# Patient Record
Sex: Male | Born: 1970
Health system: Southern US, Community
[De-identification: ages and names within clinical notes are randomized; demographics above are authoritative.]

## PROBLEM LIST (undated history)

## (undated) DIAGNOSIS — C44711 Basal cell carcinoma of skin of unspecified lower limb, including hip: Secondary | ICD-10-CM

## (undated) DIAGNOSIS — W3400XA Accidental discharge from unspecified firearms or gun, initial encounter: Secondary | ICD-10-CM

## (undated) DIAGNOSIS — C61 Malignant neoplasm of prostate: Secondary | ICD-10-CM

## (undated) DIAGNOSIS — Z87442 Personal history of urinary calculi: Secondary | ICD-10-CM

## (undated) DIAGNOSIS — K219 Gastro-esophageal reflux disease without esophagitis: Secondary | ICD-10-CM

## (undated) HISTORY — PX: OTHER SURGICAL HISTORY: SHX169

## (undated) HISTORY — PX: PROSTATE BIOPSY: SHX241

---

## 2006-03-14 ENCOUNTER — Inpatient Hospital Stay (HOSPITAL_COMMUNITY): Admission: AC | Admit: 2006-03-14 | Discharge: 2006-03-15 | Payer: Self-pay
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
## 2016-05-31 DIAGNOSIS — E7800 Pure hypercholesterolemia, unspecified: Secondary | ICD-10-CM | POA: Diagnosis not present

## 2018-04-12 DIAGNOSIS — Z0000 Encounter for general adult medical examination without abnormal findings: Secondary | ICD-10-CM | POA: Diagnosis not present

## 2018-04-12 DIAGNOSIS — Z6826 Body mass index (BMI) 26.0-26.9, adult: Secondary | ICD-10-CM | POA: Diagnosis not present

## 2018-04-12 DIAGNOSIS — E663 Overweight: Secondary | ICD-10-CM | POA: Diagnosis not present

## 2019-04-25 DIAGNOSIS — C44712 Basal cell carcinoma of skin of right lower limb, including hip: Secondary | ICD-10-CM | POA: Diagnosis not present

## 2019-04-25 DIAGNOSIS — L821 Other seborrheic keratosis: Secondary | ICD-10-CM | POA: Diagnosis not present

## 2019-04-25 DIAGNOSIS — L578 Other skin changes due to chronic exposure to nonionizing radiation: Secondary | ICD-10-CM | POA: Diagnosis not present

## 2019-04-25 DIAGNOSIS — D225 Melanocytic nevi of trunk: Secondary | ICD-10-CM | POA: Diagnosis not present

## 2019-04-25 DIAGNOSIS — L820 Inflamed seborrheic keratosis: Secondary | ICD-10-CM | POA: Diagnosis not present

## 2019-04-25 DIAGNOSIS — D2239 Melanocytic nevi of other parts of face: Secondary | ICD-10-CM | POA: Diagnosis not present

## 2019-05-23 DIAGNOSIS — C44712 Basal cell carcinoma of skin of right lower limb, including hip: Secondary | ICD-10-CM | POA: Diagnosis not present

## 2020-06-18 DIAGNOSIS — Z23 Encounter for immunization: Secondary | ICD-10-CM | POA: Diagnosis not present

## 2020-06-18 DIAGNOSIS — R5383 Other fatigue: Secondary | ICD-10-CM | POA: Diagnosis not present

## 2020-06-18 DIAGNOSIS — Z13220 Encounter for screening for lipoid disorders: Secondary | ICD-10-CM | POA: Diagnosis not present

## 2020-06-18 DIAGNOSIS — Z125 Encounter for screening for malignant neoplasm of prostate: Secondary | ICD-10-CM | POA: Diagnosis not present

## 2020-06-18 DIAGNOSIS — Z0000 Encounter for general adult medical examination without abnormal findings: Secondary | ICD-10-CM | POA: Diagnosis not present

## 2020-07-15 DIAGNOSIS — R972 Elevated prostate specific antigen [PSA]: Secondary | ICD-10-CM | POA: Diagnosis not present

## 2020-08-20 DIAGNOSIS — R972 Elevated prostate specific antigen [PSA]: Secondary | ICD-10-CM | POA: Diagnosis not present

## 2020-08-20 DIAGNOSIS — N401 Enlarged prostate with lower urinary tract symptoms: Secondary | ICD-10-CM | POA: Diagnosis not present

## 2020-09-10 DIAGNOSIS — N401 Enlarged prostate with lower urinary tract symptoms: Secondary | ICD-10-CM | POA: Diagnosis not present

## 2020-09-10 DIAGNOSIS — R972 Elevated prostate specific antigen [PSA]: Secondary | ICD-10-CM | POA: Diagnosis not present

## 2020-09-10 DIAGNOSIS — C61 Malignant neoplasm of prostate: Secondary | ICD-10-CM | POA: Diagnosis not present

## 2020-09-18 DIAGNOSIS — C61 Malignant neoplasm of prostate: Secondary | ICD-10-CM | POA: Diagnosis not present

## 2020-09-18 DIAGNOSIS — R972 Elevated prostate specific antigen [PSA]: Secondary | ICD-10-CM | POA: Diagnosis not present

## 2020-09-23 DIAGNOSIS — N200 Calculus of kidney: Secondary | ICD-10-CM | POA: Diagnosis not present

## 2020-09-23 DIAGNOSIS — C61 Malignant neoplasm of prostate: Secondary | ICD-10-CM | POA: Diagnosis not present

## 2020-09-23 DIAGNOSIS — K5730 Diverticulosis of large intestine without perforation or abscess without bleeding: Secondary | ICD-10-CM | POA: Diagnosis not present

## 2020-09-29 DIAGNOSIS — C61 Malignant neoplasm of prostate: Secondary | ICD-10-CM | POA: Diagnosis not present

## 2020-10-13 ENCOUNTER — Other Ambulatory Visit: Payer: Self-pay | Admitting: Urology

## 2020-10-13 DIAGNOSIS — C61 Malignant neoplasm of prostate: Secondary | ICD-10-CM | POA: Diagnosis not present

## 2020-10-27 NOTE — Progress Notes (Addendum)
COVID Vaccine Completed: x3 Date COVID Vaccine completed:  09-03-19 10-02-19 Has received booster:  08-21-20 COVID vaccine manufacturer:    Moderna    Date of COVID positive in last 90 days:  N/A  PCP - Collier Salina, NP Cardiologist -   Chest x-ray - N/A EKG - N/A Stress Test -  ECHO -  Cardiac Cath -  Pacemaker/ICD device last checked: Spinal Cord Stimulator:  Sleep Study - N/A CPAP -   Fasting Blood Sugar - N/A Checks Blood Sugar _____ times a day  Blood Thinner Instructions:  N/A Aspirin Instructions: Last Dose:  Activity level:  Can go up a flight of stairs and perform activities of daily living without stopping and without symptoms of chest pain or shortness of breath.  Able to exercise without symptoms    Anesthesia review: N/A  Patient denies shortness of breath, fever, cough and chest pain at PAT appointment.  BP elevated at PAT appointment 133/103, patient monitors at home and is within normal range.  Will monitor until surgery   Patient verbalized understanding of instructions that were given to them at the PAT appointment. Patient was also instructed that they will need to review over the PAT instructions again at home before surgery.

## 2020-10-27 NOTE — Patient Instructions (Addendum)
DUE TO COVID-19 ONLY ONE VISITOR IS ALLOWED TO COME WITH YOU AND STAY IN THE WAITING ROOM ONLY DURING PRE OP AND PROCEDURE.   IF YOU WILL BE ADMITTED INTO THE HOSPITAL YOU ARE ALLOWED ONLY TWO SUPPORT PEOPLE DURING VISITATION HOURS ONLY (10AM -8PM)   . The support person(s) may change daily. . The support person(s) must pass our screening, gel in and out, and wear a mask at all times, including in the patient's room. . Patients must also wear a mask when staff or their support person are in the room.  No visitors under the age of 41. Any visitor under the age of 8 must be accompanied by an adult.    COVID SWAB TESTING MUST BE COMPLETED ON:  Thursday, 10-29-20 @ 11:40 AM   [address] [place], [place] [address]  (Must self quarantine after testing. Follow instructions on handout.)    Your procedure is scheduled on:  Monday, 11-02-20   Report to [facility] Main  Entrance    Report to Short Stay at 5:30 AM   Call this number if you have problems the morning of surgery [phone]   Prep:  8 oz of Magnesium Citrate at noon day prior to surgery             Fleet enema the night before surgery   Do not eat food :After Midnight.   May have liquids until 4:15 AM  day of surgery  CLEAR LIQUID DIET  Foods Allowed                                                                     Foods Excluded  Water, Black Coffee and tea, regular and decaf              liquids that you cannot  Plain Jell-O in any flavor  (No red)                                     see through such as: Fruit ices (not with fruit pulp)                                      milk, soups, orange juice              Iced Popsicles (No red)                                      All solid food                                   Apple juices Sports drinks like Gatorade (No red) Lightly seasoned clear broth or consume(fat free) Sugar, honey syrup   Oral Hygiene is also important to reduce your risk of infection.  Remember - BRUSH YOUR TEETH THE MORNING OF SURGERY WITH YOUR REGULAR TOOTHPASTE   Do NOT smoke after Midnight   Take these medicines the morning of surgery with A SIP OF WATER: Famotidine and Loratadine if needed                               You may not have any metal on your body including, jewelry, and body piercings             Do not wear lotions, powders, perfumes/cologne, or deodorant             Men may shave face and neck.   Do not bring valuables to the hospital. [place].   Contacts, dentures or bridgework may not be worn into surgery.   Bring small overnight bag day of surgery.    Special Instructions: Bring a copy of your healthcare power of attorney and living will documents         the day of surgery if you haven't  scanned them in before.              Please read over the following fact sheets you were given: IF YOU HAVE QUESTIONS ABOUT YOUR PRE OP INSTRUCTIONS PLEASE CALL  [facility] - Preparing for Surgery Before surgery, you can play an important role.  Because skin is not sterile, your skin needs to be as free of germs as possible.  You can reduce the number of germs on your skin by washing with CHG (chlorahexidine gluconate) soap before surgery.  CHG is an antiseptic cleaner which kills germs and bonds with the skin to continue killing germs even after washing. Please DO NOT use if you have an allergy to CHG or antibacterial soaps.  If your skin becomes reddened/irritated stop using the CHG and inform your nurse when you arrive at Short Stay. Do not shave (including legs and underarms) for at least 48 hours prior to the first CHG shower.  You may shave your face/neck.  Please follow these instructions carefully:  1.  Shower with CHG Soap the night before surgery and the  morning of surgery.  2.  If you choose to wash your hair, wash your hair first as usual with  your normal  shampoo.  3.  After you shampoo, rinse your hair and body thoroughly to remove the shampoo.                             4.  Use CHG as you would any other liquid soap.  You can apply chg directly to the skin and wash.  Gently with a scrungie or clean washcloth.  5.  Apply the CHG Soap to your body ONLY FROM THE NECK DOWN.   Do   not use on face/ open                           Wound or open sores. Avoid contact with eyes, ears mouth and   genitals (private parts).                       Wash face,  Genitals (private parts) with your normal soap.  6.  Wash thoroughly, paying special attention to the area where your    surgery  will be performed.  7.  Thoroughly rinse your body with warm water from the neck down.  8.  DO NOT shower/wash with your normal soap after using and rinsing off the CHG Soap.                9.  Pat yourself dry with a clean towel.            10.  Wear clean pajamas.            11.  Place clean sheets on your bed the night of your first shower and do not  sleep with pets. Day of Surgery : Do not apply any lotions/deodorants the morning of surgery.  Please wear clean clothes to the hospital/surgery center.  FAILURE TO FOLLOW THESE INSTRUCTIONS MAY RESULT IN THE CANCELLATION OF YOUR SURGERY  PATIENT SIGNATURE_________________________________  NURSE SIGNATURE__________________________________  ________________________________________________________________________    Brandon Sosa  An incentive spirometer is a tool that can help keep your lungs clear and active. This tool measures how well you are filling your lungs with each breath. Taking long deep breaths may help reverse or decrease the chance of developing breathing (pulmonary) problems (especially infection) following:  A long period of time when you are unable to move or be active. BEFORE THE PROCEDURE   If the spirometer includes an indicator to show your best effort, your nurse  or respiratory therapist will set it to a desired goal.  If possible, sit up straight or lean slightly forward. Try not to slouch.  Hold the incentive spirometer in an upright position. INSTRUCTIONS FOR USE  1. Sit on the edge of your bed if possible, or sit up as far as you can in bed or on a chair. 2. Hold the incentive spirometer in an upright position. 3. Breathe out normally. 4. Place the mouthpiece in your mouth and seal your lips tightly around it. 5. Breathe in slowly and as deeply as possible, raising the piston or the ball toward the top of the column. 6. Hold your breath for 3-5 seconds or for as long as possible. Allow the piston or ball to fall to the bottom of the column. 7. Remove the mouthpiece from your mouth and breathe out normally. 8. Rest for a few seconds and repeat Steps 1 through 7 at least 10 times every 1-2 hours when you are awake. Take your time and take a few normal breaths between deep breaths. 9. The spirometer may include an indicator to show your best effort. Use the indicator as a goal to work toward during each repetition. 10. After each set of 10 deep breaths, practice coughing to be sure your lungs are clear. If you have an incision (the cut made at the time of surgery), support your incision when coughing by placing a pillow or rolled up towels firmly against it. Once you are able to get out of bed, walk around indoors and cough well. You may stop using the incentive spirometer when instructed by your caregiver.  RISKS AND COMPLICATIONS  Take your time so you do not get dizzy or light-headed.  If you are in pain, you may need to take or ask for pain medication before doing incentive spirometry. It is harder to take a deep breath if you are having pain. AFTER USE  Rest and breathe slowly and easily.  It can be helpful to keep track of  a log of your progress. Your caregiver can provide you with a simple table to help with this. If you are using the  spirometer at home, follow these instructions: [place] IF:   You are having difficultly using the spirometer.  You have trouble using the spirometer as often as instructed.  Your pain medication is not giving enough relief while using the spirometer.  You develop fever of 100.5 F (38.1 C) or higher. SEEK IMMEDIATE MEDICAL CARE IF:   You cough up bloody sputum that had not been present before.  You develop fever of 102 F (38.9 C) or greater.  You develop worsening pain at or near the incision site. MAKE SURE YOU:   Understand these instructions.  Will watch your condition.  Will get help right away if you are not doing well or get worse. Document Released: 12/12/2006 Document Revised: 10/24/2011 Document Reviewed: 02/12/2007 [employer] Patient Information 2014 [employer], [place].   ________________________________________________________________________  WHAT IS A BLOOD TRANSFUSION? Blood Transfusion Information  A transfusion is the replacement of blood or some of its parts. Blood is made up of multiple cells which provide different functions.  Red blood cells carry oxygen and are used for blood loss replacement.  White blood cells fight against infection.  Platelets control bleeding.  Plasma helps clot blood.  Other blood products are available for specialized needs, such as hemophilia or other clotting disorders. BEFORE THE TRANSFUSION  Who gives blood for transfusions?   Healthy volunteers who are fully evaluated to make sure their blood is safe. This is blood bank blood. Transfusion therapy is the safest it has ever been in the practice of medicine. Before blood is taken from a donor, a complete history is taken to make sure that person has no history of diseases nor engages in risky social behavior (examples are intravenous drug use or sexual activity with multiple partners). The donor's travel history is screened to minimize risk of transmitting  infections, such as malaria. The donated blood is tested for signs of infectious diseases, such as HIV and hepatitis. The blood is then tested to be sure it is compatible with you in order to minimize the chance of a transfusion reaction. If you or a relative donates blood, this is often done in anticipation of surgery and is not appropriate for emergency situations. It takes many days to process the donated blood. RISKS AND COMPLICATIONS Although transfusion therapy is very safe and saves many lives, the main dangers of transfusion include:   Getting an infectious disease.  Developing a transfusion reaction. This is an allergic reaction to something in the blood you were given. Every precaution is taken to prevent this. The decision to have a blood transfusion has been considered carefully by your caregiver before blood is given. Blood is not given unless the benefits outweigh the risks. AFTER THE TRANSFUSION  Right after receiving a blood transfusion, you will usually feel much better and more energetic. This is especially true if your red blood cells have gotten low (anemic). The transfusion raises the level of the red blood cells which carry oxygen, and this usually causes an energy increase.  The nurse administering the transfusion will monitor you carefully for complications. HOME CARE INSTRUCTIONS  No special instructions are needed after a transfusion. You may find your energy is better. Speak with your caregiver about any limitations on activity for underlying diseases you may have. SEEK MEDICAL CARE IF:   Your condition is not improving after your transfusion.  You develop redness or irritation at the intravenous (IV) site. SEEK IMMEDIATE MEDICAL CARE IF:  Any of the following symptoms occur over the next 12 hours:  Shaking chills.  You have a temperature by mouth above 102 F (38.9 C), not controlled by medicine.  Chest, back, or muscle pain.  People around you feel you are  not acting correctly or are confused.  Shortness of breath or difficulty breathing.  Dizziness and fainting.  You get a rash or develop hives.  You have a decrease in urine output.  Your urine turns a dark color or changes to pink, red, or brown. Any of the following symptoms occur over the next 10 days:  You have a temperature by mouth above 102 F (38.9 C), not controlled by medicine.  Shortness of breath.  Weakness after normal activity.  The white part of the eye turns yellow (jaundice).  You have a decrease in the amount of urine or are urinating less often.  Your urine turns a dark color or changes to pink, red, or brown. Document Released: 07/29/2000 Document Revised: 10/24/2011 Document Reviewed: 03/17/2008 [facility] Patient Information 2014 [place], [place].  _______________________________________________________________________

## 2020-10-29 ENCOUNTER — Other Ambulatory Visit (HOSPITAL_COMMUNITY)
Admission: RE | Admit: 2020-10-29 | Discharge: 2020-10-29 | Disposition: A | Discharge status: Home / Self Care | Payer: BC Managed Care – PPO | Source: Ambulatory Visit | Attending: Urology | Admitting: Urology

## 2020-10-29 ENCOUNTER — Encounter (HOSPITAL_COMMUNITY): Payer: Self-pay

## 2020-10-29 ENCOUNTER — Other Ambulatory Visit: Payer: Self-pay

## 2020-10-29 ENCOUNTER — Encounter (HOSPITAL_COMMUNITY)
Admission: RE | Admit: 2020-10-29 | Discharge: 2020-10-29 | Disposition: A | Discharge status: Home / Self Care | Payer: BC Managed Care – PPO | Source: Ambulatory Visit | Attending: Urology | Admitting: Urology

## 2020-10-29 DIAGNOSIS — Z01812 Encounter for preprocedural laboratory examination: Secondary | ICD-10-CM | POA: Diagnosis not present

## 2020-10-29 DIAGNOSIS — M6281 Muscle weakness (generalized): Secondary | ICD-10-CM | POA: Diagnosis not present

## 2020-10-29 DIAGNOSIS — Z20822 Contact with and (suspected) exposure to covid-19: Secondary | ICD-10-CM | POA: Diagnosis not present

## 2020-10-29 DIAGNOSIS — C61 Malignant neoplasm of prostate: Secondary | ICD-10-CM | POA: Diagnosis not present

## 2020-10-29 HISTORY — DX: Malignant neoplasm of prostate: C61

## 2020-10-29 HISTORY — DX: Basal cell carcinoma of skin of unspecified lower limb, including hip: C44.711

## 2020-10-29 HISTORY — DX: Personal history of urinary calculi: Z87.442

## 2020-10-29 HISTORY — DX: Gastro-esophageal reflux disease without esophagitis: K21.9

## 2020-10-29 HISTORY — DX: Accidental discharge from unspecified firearms or gun, initial encounter: W34.00XA

## 2020-10-29 LAB — SARS CORONAVIRUS 2 (TAT 6-24 HRS): SARS Coronavirus 2: NEGATIVE

## 2020-10-29 LAB — BASIC METABOLIC PANEL
Anion gap: 8 (ref 5–15)
BUN: 15 mg/dL (ref 6–20)
CO2: 27 mmol/L (ref 22–32)
Calcium: 9.2 mg/dL (ref 8.9–10.3)
Chloride: 106 mmol/L (ref 98–111)
Creatinine, Ser: 1.15 mg/dL (ref 0.61–1.24)
GFR, Estimated: >60 mL/min (ref >60)
Glucose, Bld: 103 mg/dL — ABNORMAL HIGH (ref 70–99)
Potassium: 4.1 mmol/L (ref 3.5–5.1)
Sodium: 141 mmol/L (ref 135–145)

## 2020-10-29 LAB — CBC
HCT: 47.3 % (ref 39.0–52.0)
Hemoglobin: 16.4 g/dL (ref 13.0–17.0)
MCH: 31.4 pg (ref 26.0–34.0)
MCHC: 34.7 g/dL (ref 30.0–36.0)
MCV: 90.4 fL (ref 80.0–100.0)
Platelets: 162 10*3/uL (ref 150–400)
RBC: 5.23 MIL/uL (ref 4.22–5.81)
RDW: 13 % (ref 11.5–15.5)
WBC: 3.8 10*3/uL — ABNORMAL LOW (ref 4.0–10.5)
nRBC: 0 % (ref 0.0–0.2)

## 2020-10-30 NOTE — H&P (Signed)
Office Visit Report     10/13/2020   --------------------------------------------------------------------------------   Brandon Sosa  MRN: [id number]  DOB: 05/21/1971, 50 year old Male  SSN:    PRIMARY CARE:  Marlena Clipper, NP  REFERRING:  Joie Bimler, MD  PROVIDER:  Raynelle Bring, M.D.  LOCATION:  [facility] [phone]     --------------------------------------------------------------------------------   CC/HPI: CC: Prostate Cancer   Physician requesting consult: Dr. 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   Family history: None.   Imaging studies: CT abdomen/pelvis (09/22/20): Negative for metastatic disease.   PMH: He has no major medical problems.  PSH: No abdominal surgeries.   TNM stage: cT1c N0 Mx  PSA: 4.5  Gleason score: 3+4=7  Biopsy (09/10/20): 4/10 cores positive  Left: L lateral mid (30%, 3+4=7), L mid (30%, 3+4=7), L lateral base (30%, 3+3=6), L base (20%, 3+4=7)  Right: Benign  Prostate volume: 18.7 cc   Nomogram  OC disease: 75%  EPE: 23%  SVI: 3%  LNI: 3%  PFS (5 year, 10 year): 88%, 80%   Urinary function: IPSS is 1.  Erectile function: SHIM score is 24.     ALLERGIES: Kenalog    MEDICATIONS: Claritin 10 mg tablet  Diclofenac Sodium 75 mg tablet, delayed release  Vitamin D     GU PSH: No GU PSH    NON-GU PSH: No Non-GU PSH    GU PMH: No GU PMH      PMH Notes: basal cell cancer on leg   NON-GU PMH: No Non-GU PMH    FAMILY HISTORY: hyperlipidemia - Father, Mother Hypertension - Father, Mother Prostate Cancer - Grandfather   SOCIAL HISTORY: Marital Status: Married Preferred Language: English; Ethnicity: Not Hispanic Or Latino; Race: White Current Smoking Status: Patient  has never smoked.   Tobacco Use Assessment Completed: Used Tobacco in last 30 days? Social Drinker.  Drinks 4+ caffeinated drinks per day.    REVIEW OF SYSTEMS:    GU Review Male:   Patient denies frequent urination, hard to postpone urination, burning/ pain with urination, get up at night to urinate, leakage of urine, stream starts and stops, trouble starting your streams, and have to strain to urinate .  Gastrointestinal (Upper):   Patient denies nausea and vomiting.  Gastrointestinal (Lower):   Patient denies diarrhea and constipation.  Constitutional:   Patient denies fever, night sweats, weight loss, and fatigue.  Skin:   Patient denies skin rash/ lesion and itching.  Eyes:   Patient denies blurred vision and double vision.  Ears/ Nose/ Throat:   Patient denies sore throat and sinus problems.  Hematologic/Lymphatic:   Patient denies swollen glands and easy bruising.  Cardiovascular:   Patient denies leg swelling and chest pains.  Respiratory:   Patient denies cough and shortness of breath.  Endocrine:   Patient denies excessive thirst.  Musculoskeletal:   Patient denies back pain and joint pain.  Neurological:   Patient denies headaches and dizziness.  Psychologic:   Patient denies depression and anxiety.   VITAL SIGNS:      10/13/2020 08:38 AM  Weight 187 lb / 84.82 kg  Height 71 in / 180.34 cm  BP 136/92 mmHg  Pulse 81 /min  Temperature 97.5 F / 36.3 C  BMI 26.1 kg/m   GU PHYSICAL EXAMINATION:    Prostate: Prostate about 30 grams. Left lobe normal consistency, right lobe normal consistency. Symmetrical lobes. No prostate nodule. Left lobe no tenderness, right lobe no tenderness.    MULTI-SYSTEM PHYSICAL EXAMINATION:    Constitutional: Well-nourished. No physical deformities. Normally developed. Good grooming.  Neck: Neck symmetrical, not swollen. Normal tracheal position.  Respiratory: No labored breathing, no use of accessory muscles. Clear bilaterally.   Cardiovascular: Normal temperature, normal extremity pulses, no swelling, no varicosities. Regular rate and rhythm.  Lymphatic: No enlargement of neck, axillae, groin.  Skin: No paleness, no jaundice, no cyanosis. No lesion, no ulcer, no rash.  Neurologic / Psychiatric: Oriented to time, oriented to place, oriented to person. No depression, no anxiety, no agitation.  Gastrointestinal: No mass, no tenderness, no rigidity, non obese abdomen.  Eyes: Normal conjunctivae. Normal eyelids.  Ears, Nose, Mouth, and Throat: Left ear no scars, no lesions, no masses. Right ear no scars, no lesions, no masses. Nose no scars, no lesions, no masses. Normal hearing. Normal lips.  Musculoskeletal: Normal gait and station of head and neck.     Complexity of Data:  Lab Test Review:   PSA  Records Review:   Pathology Reports, Previous Patient Records   PROCEDURES:          Urinalysis Dipstick Dipstick Cont'd  Color: Yellow Bilirubin: Neg mg/dL  Appearance: Clear Ketones: Neg mg/dL  Specific Gravity: 1.020 Blood: Neg ery/uL  pH: 6.0 Protein: Neg mg/dL  Glucose: Neg mg/dL Urobilinogen: 0.2 mg/dL    Nitrites: Neg    Leukocyte Esterase: Neg leu/uL    ASSESSMENT:      ICD-10 Details  1 GU:   Prostate Cancer - C61    PLAN:           Schedule Return Visit/Planned Activity: Other See Visit Notes             Note: Will call to schedule surgery.  Return Visit/Planned Activity: Next Available Appointment - PT Referral             Note: Please schedule patient for preoperative PT prior to radical prostatectomy.            Document Letter(s):  Created for Patient: Clinical Summary         Notes:   1. Favorable intermediate risk prostate cancer: I had a detailed discussion with Mr. [name] today regarding his prostate cancer diagnosis. Considering his disease parameters and life expectancy, I did recommend therapy of curative intent. The patient was counseled about the natural history of prostate cancer  and the standard treatment options that are available for prostate cancer. It was explained to him how his age and life expectancy, clinical stage, Gleason score, and PSA affect his prognosis, the decision to proceed with additional staging studies, as well as how that information influences recommended treatment strategies. We discussed the roles for active surveillance, radiation therapy, surgical therapy, androgen deprivation, as well as ablative therapy options for the treatment of prostate cancer as appropriate to his individual cancer situation. We discussed the risks and benefits of these options with regard to their impact on cancer control and also in terms of potential adverse events, complications, and impact on quality of life particularly related to urinary and sexual function. The patient was encouraged to ask questions throughout the discussion today and all questions were answered to his stated satisfaction. In addition, the patient was provided with  and/or directed to appropriate resources and literature for further education about prostate cancer and treatment options. We discussed surgical therapy for prostate cancer including the different available surgical approaches. We discussed, in detail, the risks and expectations of surgery with regard to cancer control, urinary control, and erectile function as well as the expected postoperative recovery process. Additional risks of surgery including but not limited to bleeding, infection, hernia formation, nerve damage, lymphocele formation, bowel/rectal injury potentially necessitating colostomy, damage to the urinary tract resulting in urine leakage, urethral stricture, and the cardiopulmonary risks such as myocardial infarction, stroke, death, venothromboembolism, etc. were explained. The risk of open surgical conversion for robotic/laparoscopic prostatectomy was also discussed.   He does adamantly wished to proceed with surgical therapy  considering his young age and long life expectancy. He will be scheduled for a bilateral nerve-sparing robot assisted laparoscopic radical prostatectomy and bilateral pelvic lymphadenectomy.   Cc: Marlena Clipper, NP  Dr. Joie Bimler         Next Appointment:      Next Appointment: 10/29/2020 09:00 AM    Appointment Type: 60 Physical Therapy    Location: [facility] [phone]    Provider: Nyra Capes    Reason for Visit: Inital pt eval for preoperative PT prior to radical prostatectomy.      E & M CODES: We spent 65 minutes dedicated to evaluation and management time, including face to face interaction, discussions on coordination of care, documentation, result review, and discussion with others as applicable.     * Signed by Raynelle Bring, M.D. on 10/14/20 at 5:15 PM (EST)*     The information contained in this medical record document is considered private and confidential patient information. This information can only be used for the medical diagnosis and/or medical services that are being provided by the patient's selected caregivers. This information can only be distributed

## 2020-11-01 NOTE — Anesthesia Preprocedure Evaluation (Addendum)
Anesthesia Evaluation  Patient identified by MRN, date of birth, ID band Patient awake    Reviewed: Allergy & Precautions, NPO status , Patient's Chart, lab work & pertinent test results  History of Anesthesia Complications Negative for: history of anesthetic complications  Airway Mallampati: I  TM Distance: >3 FB Neck ROM: Full    Dental no notable dental hx. (+) Teeth Intact   Pulmonary neg pulmonary ROS,    Pulmonary exam normal        Cardiovascular negative cardio ROS Normal cardiovascular exam     Neuro/Psych negative neurological ROS  negative psych ROS   GI/Hepatic Neg liver ROS, GERD  Medicated,  Endo/Other  negative endocrine ROS  Renal/GU negative Renal ROS   Prostate cancer    Musculoskeletal negative musculoskeletal ROS (+)   Abdominal   Peds  Hematology negative hematology ROS (+)   Anesthesia Other Findings   Reproductive/Obstetrics                            Anesthesia Physical Anesthesia Plan  ASA: II  Anesthesia Plan: General   Post-op Pain Management:    Induction: Intravenous  PONV Risk Score and Plan: 2 and Ondansetron, Dexamethasone, Treatment may vary due to age or medical condition and Midazolam  Airway Management Planned: Oral ETT  Additional Equipment: None  Intra-op Plan:   Post-operative Plan: Extubation in OR  Informed Consent: I have reviewed the patients History and Physical, chart, labs and discussed the procedure including the risks, benefits and alternatives for the proposed anesthesia with the patient or authorized representative who has indicated his/her understanding and acceptance.     Dental advisory given  Plan Discussed with:   Anesthesia Plan Comments:        Anesthesia Quick Evaluation

## 2020-11-02 ENCOUNTER — Encounter (HOSPITAL_COMMUNITY): Payer: Self-pay | Admitting: Urology

## 2020-11-02 ENCOUNTER — Observation Stay (HOSPITAL_COMMUNITY)
Admission: RE | Admit: 2020-11-02 | Discharge: 2020-11-03 | Disposition: A | Discharge status: Home / Self Care | Payer: BC Managed Care – PPO | Attending: Urology | Admitting: Urology

## 2020-11-02 ENCOUNTER — Ambulatory Visit (HOSPITAL_COMMUNITY): Payer: BC Managed Care – PPO | Admitting: Certified Registered"

## 2020-11-02 ENCOUNTER — Encounter (HOSPITAL_COMMUNITY)
Admission: RE | Disposition: A | Discharge status: Home / Self Care | Payer: Self-pay | Source: Home / Self Care | Attending: Urology

## 2020-11-02 ENCOUNTER — Other Ambulatory Visit: Payer: Self-pay

## 2020-11-02 DIAGNOSIS — R972 Elevated prostate specific antigen [PSA]: Secondary | ICD-10-CM | POA: Diagnosis not present

## 2020-11-02 DIAGNOSIS — C61 Malignant neoplasm of prostate: Principal | ICD-10-CM | POA: Diagnosis present

## 2020-11-02 DIAGNOSIS — K219 Gastro-esophageal reflux disease without esophagitis: Secondary | ICD-10-CM | POA: Diagnosis not present

## 2020-11-02 DIAGNOSIS — Z79899 Other long term (current) drug therapy: Secondary | ICD-10-CM | POA: Diagnosis not present

## 2020-11-02 DIAGNOSIS — C44711 Basal cell carcinoma of skin of unspecified lower limb, including hip: Secondary | ICD-10-CM | POA: Diagnosis not present

## 2020-11-02 HISTORY — PX: LYMPHADENECTOMY: SHX5960

## 2020-11-02 HISTORY — PX: ROBOT ASSISTED LAPAROSCOPIC RADICAL PROSTATECTOMY: SHX5141

## 2020-11-02 LAB — ABO/RH: ABO/RH(D): A POS

## 2020-11-02 LAB — TYPE AND SCREEN
ABO/RH(D): A POS
Antibody Screen: NEGATIVE

## 2020-11-02 LAB — HEMOGLOBIN AND HEMATOCRIT, BLOOD
HCT: 39 % (ref 39.0–52.0)
Hemoglobin: 13.5 g/dL (ref 13.0–17.0)

## 2020-11-02 SURGERY — XI ROBOTIC ASSISTED LAPAROSCOPIC RADICAL PROSTATECTOMY LEVEL 2
Anesthesia: General

## 2020-11-02 MED ORDER — FENTANYL CITRATE (PF) 100 MCG/2ML IJ SOLN
25.0000 ug | INTRAMUSCULAR | Status: DC | PRN
  Administered 2020-11-02: 50 ug via INTRAVENOUS

## 2020-11-02 MED ORDER — BACITRACIN-NEOMYCIN-POLYMYXIN 400-5-5000 EX OINT
1.0000 "application " | TOPICAL_OINTMENT | Freq: Three times a day (TID) | CUTANEOUS | Status: DC | PRN
  Administered 2020-11-02: 1 via TOPICAL

## 2020-11-02 MED ORDER — SODIUM CHLORIDE 0.9 % IV BOLUS
1000.0000 mL | Freq: Once | INTRAVENOUS | Status: AC
  Administered 2020-11-02: 1000 mL via INTRAVENOUS

## 2020-11-02 MED ORDER — ACETAMINOPHEN 10 MG/ML IV SOLN
INTRAVENOUS | Status: AC
  Filled 2020-11-02: qty 100

## 2020-11-02 MED ORDER — STERILE WATER FOR IRRIGATION IR SOLN
Status: DC | PRN
  Administered 2020-11-02: 1000 mL

## 2020-11-02 MED ORDER — GLYCOPYRROLATE PF 0.2 MG/ML IJ SOSY
PREFILLED_SYRINGE | INTRAMUSCULAR | Status: DC | PRN
  Administered 2020-11-02 (×2): .1 mg via INTRAVENOUS

## 2020-11-02 MED ORDER — ACETAMINOPHEN 325 MG PO TABS: 650.0000 mg | ORAL_TABLET | ORAL | Status: DC | PRN

## 2020-11-02 MED ORDER — CHLORHEXIDINE GLUCONATE CLOTH 2 % EX PADS
6.0000 | MEDICATED_PAD | Freq: Every day | CUTANEOUS | Status: DC
  Administered 2020-11-02: 6 via TOPICAL

## 2020-11-02 MED ORDER — LORATADINE 10 MG PO TABS: 10.0000 mg | ORAL_TABLET | Freq: Every day | ORAL | Status: DC | PRN

## 2020-11-02 MED ORDER — ACETAMINOPHEN 10 MG/ML IV SOLN
INTRAVENOUS | Status: DC | PRN
  Administered 2020-11-02: 1000 mg via INTRAVENOUS

## 2020-11-02 MED ORDER — HEPARIN SODIUM (PORCINE) 1000 UNIT/ML IJ SOLN
INTRAMUSCULAR | Status: AC
  Filled 2020-11-02: qty 1

## 2020-11-02 MED ORDER — KETOROLAC TROMETHAMINE 15 MG/ML IJ SOLN
15.0000 mg | Freq: Four times a day (QID) | INTRAMUSCULAR | Status: DC
  Administered 2020-11-02 – 2020-11-03 (×5): 15 mg via INTRAVENOUS
  Filled 2020-11-02 (×5): qty 1

## 2020-11-02 MED ORDER — ORAL CARE MOUTH RINSE: 15.0000 mL | Freq: Once | OROMUCOSAL | Status: AC

## 2020-11-02 MED ORDER — CHLORHEXIDINE GLUCONATE 0.12 % MT SOLN
15.0000 mL | Freq: Once | OROMUCOSAL | Status: AC
  Administered 2020-11-02: 15 mL via OROMUCOSAL

## 2020-11-02 MED ORDER — MIDAZOLAM HCL 5 MG/5ML IJ SOLN
INTRAMUSCULAR | Status: DC | PRN
  Administered 2020-11-02: 2 mg via INTRAVENOUS

## 2020-11-02 MED ORDER — FENTANYL CITRATE (PF) 100 MCG/2ML IJ SOLN
INTRAMUSCULAR | Status: DC | PRN
  Administered 2020-11-02 (×2): 50 ug via INTRAVENOUS
  Administered 2020-11-02: 25 ug via INTRAVENOUS
  Administered 2020-11-02 (×2): 100 ug via INTRAVENOUS
  Administered 2020-11-02: 25 ug via INTRAVENOUS

## 2020-11-02 MED ORDER — ONDANSETRON HCL 4 MG/2ML IJ SOLN: 4.0000 mg | INTRAMUSCULAR | Status: DC | PRN

## 2020-11-02 MED ORDER — CEFAZOLIN SODIUM-DEXTROSE 2-4 GM/100ML-% IV SOLN
2.0000 g | Freq: Once | INTRAVENOUS | Status: AC
  Administered 2020-11-02: 2 g via INTRAVENOUS

## 2020-11-02 MED ORDER — LACTATED RINGERS IV SOLN: INTRAVENOUS | Status: DC | PRN

## 2020-11-02 MED ORDER — CEFAZOLIN SODIUM-DEXTROSE 2-4 GM/100ML-% IV SOLN
INTRAVENOUS | Status: AC
  Filled 2020-11-02: qty 100

## 2020-11-02 MED ORDER — SULFAMETHOXAZOLE-TRIMETHOPRIM 800-160 MG PO TABS: 1.0000 | ORAL_TABLET | Freq: Two times a day (BID) | ORAL | 0 refills | Status: AC

## 2020-11-02 MED ORDER — MORPHINE SULFATE (PF) 2 MG/ML IV SOLN
2.0000 mg | INTRAVENOUS | Status: DC | PRN
  Administered 2020-11-02: 2 mg via INTRAVENOUS
  Filled 2020-11-02: qty 1

## 2020-11-02 MED ORDER — BUPIVACAINE-EPINEPHRINE (PF) 0.25% -1:200000 IJ SOLN
INTRAMUSCULAR | Status: AC
  Filled 2020-11-02: qty 30

## 2020-11-02 MED ORDER — PROPOFOL 10 MG/ML IV BOLUS
INTRAVENOUS | Status: DC | PRN
  Administered 2020-11-02: 200 mg via INTRAVENOUS

## 2020-11-02 MED ORDER — MAGNESIUM CITRATE PO SOLN
1.0000 | Freq: Once | ORAL | Status: DC
  Filled 2020-11-02: qty 296

## 2020-11-02 MED ORDER — BELLADONNA ALKALOIDS-OPIUM 16.2-60 MG RE SUPP: 1.0000 | Freq: Four times a day (QID) | RECTAL | Status: DC | PRN

## 2020-11-02 MED ORDER — DOCUSATE SODIUM 100 MG PO CAPS
100.0000 mg | ORAL_CAPSULE | Freq: Two times a day (BID) | ORAL | Status: DC
  Administered 2020-11-02 – 2020-11-03 (×2): 100 mg via ORAL
  Filled 2020-11-02 (×2): qty 1

## 2020-11-02 MED ORDER — KCL IN DEXTROSE-NACL 20-5-0.45 MEQ/L-%-% IV SOLN
INTRAVENOUS | Status: DC
  Filled 2020-11-02 (×2): qty 1000

## 2020-11-02 MED ORDER — SODIUM CHLORIDE 0.9 % IR SOLN
Status: DC | PRN
  Administered 2020-11-02: 1000 mL via INTRAVESICAL

## 2020-11-02 MED ORDER — FLEET ENEMA 7-19 GM/118ML RE ENEM
1.0000 | ENEMA | Freq: Once | RECTAL | Status: DC
  Filled 2020-11-02: qty 1

## 2020-11-02 MED ORDER — LACTATED RINGERS IV SOLN: INTRAVENOUS | Status: DC

## 2020-11-02 MED ORDER — OXYCODONE HCL 5 MG/5ML PO SOLN
5.0000 mg | Freq: Once | ORAL | Status: DC | PRN
Start: 2020-11-02 — End: 2020-11-02

## 2020-11-02 MED ORDER — ROCURONIUM BROMIDE 10 MG/ML (PF) SYRINGE
PREFILLED_SYRINGE | INTRAVENOUS | Status: DC | PRN
  Administered 2020-11-02: 20 mg via INTRAVENOUS
  Administered 2020-11-02: 10 mg via INTRAVENOUS
  Administered 2020-11-02: 30 mg via INTRAVENOUS
  Administered 2020-11-02: 80 mg via INTRAVENOUS
  Administered 2020-11-02: 10 mg via INTRAVENOUS

## 2020-11-02 MED ORDER — DIPHENHYDRAMINE HCL 50 MG/ML IJ SOLN
12.5000 mg | Freq: Four times a day (QID) | INTRAMUSCULAR | Status: DC | PRN
Start: 2020-11-02 — End: 2020-11-03

## 2020-11-02 MED ORDER — BELLADONNA ALKALOIDS-OPIUM 16.2-60 MG RE SUPP
RECTAL | Status: AC
  Administered 2020-11-02: 1 via RECTAL
  Filled 2020-11-02: qty 1

## 2020-11-02 MED ORDER — SUGAMMADEX SODIUM 200 MG/2ML IV SOLN
INTRAVENOUS | Status: DC | PRN
  Administered 2020-11-02: 200 mg via INTRAVENOUS

## 2020-11-02 MED ORDER — FENTANYL CITRATE (PF) 100 MCG/2ML IJ SOLN
INTRAMUSCULAR | Status: AC
  Administered 2020-11-02: 50 ug via INTRAVENOUS
  Filled 2020-11-02: qty 2

## 2020-11-02 MED ORDER — BUPIVACAINE-EPINEPHRINE 0.25% -1:200000 IJ SOLN
INTRAMUSCULAR | Status: DC | PRN
  Administered 2020-11-02: 30 mL

## 2020-11-02 MED ORDER — PHENYLEPHRINE 40 MCG/ML (10ML) SYRINGE FOR IV PUSH (FOR BLOOD PRESSURE SUPPORT)
PREFILLED_SYRINGE | INTRAVENOUS | Status: DC | PRN
  Administered 2020-11-02 (×3): 80 ug via INTRAVENOUS

## 2020-11-02 MED ORDER — DEXAMETHASONE SODIUM PHOSPHATE 10 MG/ML IJ SOLN
INTRAMUSCULAR | Status: DC | PRN
  Administered 2020-11-02: 10 mg via INTRAVENOUS

## 2020-11-02 MED ORDER — CEFAZOLIN SODIUM-DEXTROSE 1-4 GM/50ML-% IV SOLN
1.0000 g | Freq: Three times a day (TID) | INTRAVENOUS | Status: AC
  Administered 2020-11-02 – 2020-11-03 (×2): 1 g via INTRAVENOUS
  Filled 2020-11-02 (×2): qty 50

## 2020-11-02 MED ORDER — DIPHENHYDRAMINE HCL 12.5 MG/5ML PO ELIX: 12.5000 mg | ORAL_SOLUTION | Freq: Four times a day (QID) | ORAL | Status: DC | PRN

## 2020-11-02 MED ORDER — ZOLPIDEM TARTRATE 5 MG PO TABS: 5.0000 mg | ORAL_TABLET | Freq: Every evening | ORAL | Status: DC | PRN

## 2020-11-02 MED ORDER — OXYCODONE HCL 5 MG PO TABS: 5.0000 mg | ORAL_TABLET | Freq: Once | ORAL | Status: DC | PRN

## 2020-11-02 MED ORDER — MENTHOL 3 MG MT LOZG
1.0000 | LOZENGE | OROMUCOSAL | Status: DC | PRN
  Administered 2020-11-02: 3 mg via ORAL
  Filled 2020-11-02: qty 9

## 2020-11-02 MED ORDER — LIDOCAINE 2% (20 MG/ML) 5 ML SYRINGE
INTRAMUSCULAR | Status: DC | PRN
  Administered 2020-11-02: 80 mg via INTRAVENOUS

## 2020-11-02 MED ORDER — FAMOTIDINE 20 MG PO TABS: 20.0000 mg | ORAL_TABLET | Freq: Every day | ORAL | Status: DC | PRN

## 2020-11-02 MED ORDER — AMISULPRIDE (ANTIEMETIC) 5 MG/2ML IV SOLN: 10.0000 mg | Freq: Once | INTRAVENOUS | Status: DC | PRN

## 2020-11-02 MED ORDER — ONDANSETRON HCL 4 MG/2ML IJ SOLN: 4.0000 mg | Freq: Once | INTRAMUSCULAR | Status: DC | PRN

## 2020-11-02 MED ORDER — PHENYLEPHRINE HCL-NACL 10-0.9 MG/250ML-% IV SOLN
INTRAVENOUS | Status: DC | PRN
  Administered 2020-11-02: 30 ug/min via INTRAVENOUS

## 2020-11-02 MED ORDER — EPHEDRINE SULFATE-NACL 50-0.9 MG/10ML-% IV SOSY
PREFILLED_SYRINGE | INTRAVENOUS | Status: DC | PRN
  Administered 2020-11-02: 5 mg via INTRAVENOUS
  Administered 2020-11-02: 10 mg via INTRAVENOUS

## 2020-11-02 MED ORDER — ONDANSETRON HCL 4 MG/2ML IJ SOLN
INTRAMUSCULAR | Status: DC | PRN
  Administered 2020-11-02: 4 mg via INTRAVENOUS

## 2020-11-02 MED ORDER — TRAMADOL HCL 50 MG PO TABS: 50.0000 mg | ORAL_TABLET | Freq: Four times a day (QID) | ORAL | 0 refills | Status: AC | PRN

## 2020-11-02 MED ORDER — FENTANYL CITRATE (PF) 250 MCG/5ML IJ SOLN
INTRAMUSCULAR | Status: AC
  Filled 2020-11-02: qty 5

## 2020-11-02 SURGICAL SUPPLY — 66 items
ADH SKN CLS APL DERMABOND .7 (GAUZE/BANDAGES/DRESSINGS) ×2
APL PRP STRL LF DISP 70% ISPRP (MISCELLANEOUS) ×2
APL SWBSTK 6 STRL LF DISP (MISCELLANEOUS) ×2
APPLICATOR COTTON TIP 6 STRL (MISCELLANEOUS) ×2 IMPLANT
APPLICATOR COTTON TIP 6IN STRL (MISCELLANEOUS) ×3
CATH FOLEY 2WAY SLVR 18FR 30CC (CATHETERS) ×3 IMPLANT
CATH ROBINSON RED A/P 16FR (CATHETERS) ×3 IMPLANT
CATH ROBINSON RED A/P 8FR (CATHETERS) ×3 IMPLANT
CATH TIEMANN FOLEY 18FR 5CC (CATHETERS) ×3 IMPLANT
CHLORAPREP W/TINT 26 (MISCELLANEOUS) ×3 IMPLANT
CLIP VESOLOCK LG 6/CT PURPLE (CLIP) ×6 IMPLANT
COVER SURGICAL LIGHT HANDLE (MISCELLANEOUS) ×3 IMPLANT
COVER TIP SHEARS 8 DVNC (MISCELLANEOUS) ×2 IMPLANT
COVER TIP SHEARS 8MM DA VINCI (MISCELLANEOUS) ×3
COVER WAND RF STERILE (DRAPES) IMPLANT
CUTTER ECHEON FLEX ENDO 45 340 (ENDOMECHANICALS) ×3 IMPLANT
DECANTER SPIKE VIAL GLASS SM (MISCELLANEOUS) ×3 IMPLANT
DERMABOND ADVANCED (GAUZE/BANDAGES/DRESSINGS) ×1
DERMABOND ADVANCED .7 DNX12 (GAUZE/BANDAGES/DRESSINGS) ×2 IMPLANT
DRAIN CHANNEL RND F F (WOUND CARE) IMPLANT
DRAPE ARM DVNC X/XI (DISPOSABLE) ×8 IMPLANT
DRAPE COLUMN DVNC XI (DISPOSABLE) ×2 IMPLANT
DRAPE DA VINCI XI ARM (DISPOSABLE) ×12
DRAPE DA VINCI XI COLUMN (DISPOSABLE) ×3
DRAPE SURG IRRIG POUCH 19X23 (DRAPES) ×3 IMPLANT
DRSG TEGADERM 4X4.75 (GAUZE/BANDAGES/DRESSINGS) ×3 IMPLANT
ELECT PENCIL ROCKER SW 15FT (MISCELLANEOUS) ×3 IMPLANT
ELECT REM PT RETURN 15FT ADLT (MISCELLANEOUS) ×3 IMPLANT
GAUZE SPONGE 2X2 8PLY STRL LF (GAUZE/BANDAGES/DRESSINGS) IMPLANT
GLOVE SURG ENC MOIS LTX SZ6.5 (GLOVE) ×3 IMPLANT
GLOVE SURG ENC TEXT LTX SZ7.5 (GLOVE) ×6 IMPLANT
GOWN STRL REUS W/TWL LRG LVL3 (GOWN DISPOSABLE) ×9 IMPLANT
HOLDER FOLEY CATH W/STRAP (MISCELLANEOUS) ×3 IMPLANT
IRRIG SUCT STRYKERFLOW 2 WTIP (MISCELLANEOUS) ×3
IRRIGATION SUCT STRKRFLW 2 WTP (MISCELLANEOUS) ×2 IMPLANT
IV LACTATED RINGERS 1000ML (IV SOLUTION) ×3 IMPLANT
KIT TURNOVER KIT A (KITS) ×3 IMPLANT
NDL SAFETY ECLIPSE 18X1.5 (NEEDLE) ×2 IMPLANT
NEEDLE HYPO 18GX1.5 SHARP (NEEDLE) ×3
PACK ROBOT UROLOGY CUSTOM (CUSTOM PROCEDURE TRAY) ×3 IMPLANT
PENCIL SMOKE EVACUATOR (MISCELLANEOUS) IMPLANT
RELOAD STAPLE 45 4.1 GRN THCK (STAPLE) ×2 IMPLANT
SEAL CANN UNIV 5-8 DVNC XI (MISCELLANEOUS) ×8 IMPLANT
SEAL XI 5MM-8MM UNIVERSAL (MISCELLANEOUS) ×12
SET TUBE SMOKE EVAC HIGH FLOW (TUBING) ×3 IMPLANT
SOLUTION ELECTROLUBE (MISCELLANEOUS) ×3 IMPLANT
SPONGE GAUZE 2X2 STER 10/PKG (GAUZE/BANDAGES/DRESSINGS) ×1
STAPLE RELOAD 45 GRN (STAPLE) ×2 IMPLANT
STAPLE RELOAD 45MM GREEN (STAPLE) ×3
SUT ETHILON 3 0 PS 1 (SUTURE) ×3 IMPLANT
SUT MNCRL 3 0 RB1 (SUTURE) ×2 IMPLANT
SUT MNCRL 3 0 VIOLET RB1 (SUTURE) ×2 IMPLANT
SUT MNCRL AB 4-0 PS2 18 (SUTURE) ×6 IMPLANT
SUT MONOCRYL 3 0 RB1 (SUTURE) ×6
SUT VIC AB 0 CT1 27 (SUTURE) ×3
SUT VIC AB 0 CT1 27XBRD ANTBC (SUTURE) ×2 IMPLANT
SUT VIC AB 0 UR5 27 (SUTURE) ×3 IMPLANT
SUT VIC AB 2-0 SH 27 (SUTURE) ×3
SUT VIC AB 2-0 SH 27X BRD (SUTURE) ×2 IMPLANT
SUT VIC AB 3-0 SH 27 (SUTURE) ×3
SUT VIC AB 3-0 SH 27X BRD (SUTURE) IMPLANT
SUT VICRYL 0 UR6 27IN ABS (SUTURE) ×7 IMPLANT
SYR 27GX1/2 1ML LL SAFETY (SYRINGE) ×3 IMPLANT
TOWEL OR NON WOVEN STRL DISP B (DISPOSABLE) ×3 IMPLANT
TROCAR XCEL NON-BLD 5MMX100MML (ENDOMECHANICALS) IMPLANT
WATER STERILE IRR 1000ML POUR (IV SOLUTION) ×3 IMPLANT

## 2020-11-02 NOTE — Anesthesia Postprocedure Evaluation (Signed)
Anesthesia Post Note  Patient: Brandon Sosa  Procedure(s) Performed: XI ROBOTIC ASSISTED LAPAROSCOPIC RADICAL PROSTATECTOMY LEVEL 2 (N/A ) LYMPHADENECTOMY, PELVIC (Bilateral )     Patient location during evaluation: PACU Anesthesia Type: General Level of consciousness: awake and alert Pain management: pain level controlled Vital Signs Assessment: post-procedure vital signs reviewed and stable Respiratory status: spontaneous breathing, nonlabored ventilation and respiratory function stable Cardiovascular status: blood pressure returned to baseline and stable Postop Assessment: no apparent nausea or vomiting Anesthetic complications: no   No complications documented.  Last Vitals:  Vitals:   11/02/20 1115 11/02/20 1130  BP: 130/76 125/69  Pulse: (!) 101 100  Resp: 15 16  Temp:    SpO2: 100% 100%    Last Pain:  Vitals:   11/02/20 1130  TempSrc:   PainSc: 3                  Lidia Collum

## 2020-11-02 NOTE — Anesthesia Procedure Notes (Signed)
Procedure Name: Intubation Performed by: Cleda Daub, CRNA Pre-anesthesia Checklist: Patient identified, Emergency Drugs available, Suction available and Patient being monitored Patient Re-evaluated:Patient Re-evaluated prior to induction Oxygen Delivery Method: Circle system utilized Preoxygenation: Pre-oxygenation with 100% oxygen Induction Type: IV induction Ventilation: Mask ventilation without difficulty Laryngoscope Size: Mac and 4 Grade View: Grade I Tube type: Oral Tube size: 7.5 mm Number of attempts: 1 Airway Equipment and Method: Stylet and Oral airway Placement Confirmation: ETT inserted through vocal cords under direct vision,  positive ETCO2 and breath sounds checked- equal and bilateral Secured at: 23 cm Tube secured with: Tape Dental Injury: Teeth and Oropharynx as per pre-operative assessment

## 2020-11-02 NOTE — TOC Progression Note (Signed)
Transition of Care [facility]) - Progression Note    Patient Details  Name: Brandon Sosa MRN: [id number] Date of Birth: 1971-01-01  Transition of Care [facility]) CM/SW Contact  Purcell Mouton, RN Phone Number: 11/02/2020, 3:19 PM  Clinical Narrative:    Chart was reviewed by TOC. Will continue to follow for discharge needs.    Expected Discharge Plan: Home/Self Care Barriers to Discharge: No Barriers Identified  Expected Discharge Plan and Services Expected Discharge Plan: Home/Self Care       Living arrangements for the past 2 months: Single Family Home                                       Social Determinants of Health (SDOH) Interventions    Readmission Risk Interventions No flowsheet data found.

## 2020-11-02 NOTE — Op Note (Signed)
Preoperative diagnosis: Clinically localized adenocarcinoma of the prostate (clinical stage T1c N0 Mx)  Postoperative diagnosis: Clinically localized adenocarcinoma of the prostate (clinical stage T1c N0 Mx)  Procedure:  1. Robotic assisted laparoscopic radical prostatectomy (bilateral nerve sparing) 2. Bilateral robotic assisted laparoscopic pelvic lymphadenectomy  Surgeon: Pryor Curia. M.D.  Assistant: Debbrah Alar, PA-C  An assistant was required for this surgical procedure.  The duties of the assistant included but were not limited to suctioning, passing suture, camera manipulation, retraction. This procedure would not be able to be performed without an Environmental consultant.  Resident: Dr. Celene Squibb  Anesthesia: General  Complications: None  EBL: 100 mL  IVF:  2000 mL crystalloid  Specimens: 1. Prostate and seminal vesicles 2. Right pelvic lymph nodes 3. Left pelvic lymph nodes  Disposition of specimens: Pathology  Drains: 1. 20 Fr coude catheter 2. # 19 Blake pelvic drain  Indication: Brandon Sosa is a 50 y.o. year old patient with clinically localized prostate cancer.  After a thorough review of the management options for treatment of prostate cancer, he elected to proceed with surgical therapy and the above procedure(s).  We have discussed the potential benefits and risks of the procedure, side effects of the proposed treatment, the likelihood of the patient achieving the goals of the procedure, and any potential problems that might occur during the procedure or recuperation. Informed consent has been obtained.  Description of procedure:  The patient was taken to the operating room and a general anesthetic was administered. He was given preoperative antibiotics, placed in the dorsal lithotomy position, and prepped and draped in the usual sterile fashion. Next a preoperative timeout was performed. A urethral catheter was placed into the bladder and a site was  selected near the umbilicus for placement of the camera port. This was placed using a standard open Hassan technique which allowed entry into the peritoneal cavity under direct vision and without difficulty. An 8 mm robotic port was placed and a pneumoperitoneum established. The camera was then used to inspect the abdomen and there was no evidence of any intra-abdominal injuries or other abnormalities. The remaining abdominal ports were then placed. 8 mm robotic ports were placed in the right lower quadrant, left lower quadrant, and far left lateral abdominal wall. A 5 mm port was placed in the right upper quadrant and a 12 mm port was placed in the right lateral abdominal wall for laparoscopic assistance. All ports were placed under direct vision without difficulty. The surgical cart was then docked.   Utilizing the cautery scissors, the bladder was reflected posteriorly allowing entry into the space of Retzius and identification of the endopelvic fascia and prostate. The periprostatic fat was then removed from the prostate allowing full exposure of the endopelvic fascia. The endopelvic fascia was then incised from the apex back to the base of the prostate bilaterally and the underlying levator muscle fibers were swept laterally off the prostate thereby isolating the dorsal venous complex. The dorsal vein was then stapled and divided with a 45 mm Flex Echelon stapler. Attention then turned to the bladder neck which was divided anteriorly thereby allowing entry into the bladder and exposure of the urethral catheter. The catheter balloon was deflated and the catheter was brought into the operative field and used to retract the prostate anteriorly. The posterior bladder neck was then examined and was divided allowing further dissection between the bladder and prostate posteriorly until the vasa deferentia and seminal vessels were identified. The vasa deferentia were  isolated, divided, and lifted anteriorly. The  seminal vesicles were dissected down to their tips with care to control the seminal vascular arterial blood supply. These structures were then lifted anteriorly and the space between Denonvilliers fascia and the anterior rectum was developed with a combination of sharp and blunt dissection. This isolated the vascular pedicles of the prostate.  The lateral prostatic fascia was then sharply incised allowing release of the neurovascular bundles bilaterally. The vascular pedicles of the prostate were then ligated with Weck clips between the prostate and neurovascular bundles and divided with sharp cold scissor dissection resulting in neurovascular bundle preservation. The neurovascular bundles were then separated off the apex of the prostate and urethra bilaterally.  The urethra was then sharply transected allowing the prostate specimen to be disarticulated. The pelvis was copiously irrigated and hemostasis was ensured. There was no evidence for rectal injury.  Attention then turned to the right pelvic sidewall. The fibrofatty tissue between the external iliac vein, confluence of the iliac vessels, hypogastric artery, and Cooper's ligament was dissected free from the pelvic sidewall with care to preserve the obturator nerve. Weck clips were used for lymphostasis and hemostasis. An identical procedure was performed on the contralateral side and the lymphatic packets were removed for permanent pathologic analysis.  Attention then turned to the urethral anastomosis. A 2-0 Vicryl slip knot was placed between Denonvilliers fascia, the posterior bladder neck, and the posterior urethra to reapproximate these structures. A double-armed 3-0 Monocryl suture was then used to perform a 360 running tension-free anastomosis between the bladder neck and urethra. A new urethral catheter was then placed into the bladder and irrigated. There were no blood clots within the bladder and the anastomosis appeared to be watertight.  A #19 Blake drain was then brought through the left lateral 8 mm port site and positioned appropriately within the pelvis. It was secured to the skin with a nylon suture. The surgical cart was then undocked. The right lateral 12 mm port site was closed at the fascial level with a 0 Vicryl suture placed laparoscopically. All remaining ports were then removed under direct vision. The prostate specimen was removed intact within the Endopouch retrieval bag via the periumbilical camera port site. This fascial opening was closed with two running 0 Vicryl sutures. 0.25% Marcaine was then injected into all port sites and all incisions were reapproximated at the skin level with 4-0 Monocryl subcuticular sutures and Dermabond. The patient appeared to tolerate the procedure well and without complications. The patient was able to be extubated and transferred to the recovery unit in satisfactory condition.   Pryor Curia MD

## 2020-11-02 NOTE — Interval H&P Note (Signed)
History and Physical Interval Note:  11/02/2020 6:56 AM  Brandon Sosa  has presented today for surgery, with the diagnosis of PROSTATE CANCER.  The various methods of treatment have been discussed with the patient and family. After consideration of risks, benefits and other options for treatment, the patient has consented to  Procedure(s): XI ROBOTIC ASSISTED LAPAROSCOPIC RADICAL PROSTATECTOMY LEVEL 2 (N/A) LYMPHADENECTOMY, PELVIC (Bilateral) as a surgical intervention.  The patient's history has been reviewed, patient examined, no change in status, stable for surgery.  I have reviewed the patient's chart and labs.  Questions were answered to the patient's satisfaction.     [employer]

## 2020-11-02 NOTE — Discharge Instructions (Signed)
1. Activity:  You are encouraged to ambulate frequently (about every hour during waking hours) to help prevent blood clots from forming in your legs or lungs.  However, you should not engage in any heavy lifting (> 10-15 lbs), strenuous activity, or straining. 2. Diet: You should continue a clear liquid diet until passing gas from below.  Once this occurs, you may advance your diet to a soft diet that would be easy to digest (i.e soups, scrambled eggs, mashed potatoes, etc.) for 24 hours just as you would if getting over a bad stomach flu.  If tolerating this diet well for 24 hours, you may then begin eating regular food.  It will be normal to have some amount of bloating, nausea, and abdominal discomfort intermittently. 3. Prescriptions:  You will be provided a prescription for pain medication to take as needed.  If your pain is not severe enough to require the prescription pain medication, you may take Tylenol instead.  You should also take an over the counter stool softener (Colace 100 mg twice daily) to avoid straining with bowel movements as the pain medication may constipate you. Finally, you will also be provided a prescription for an antibiotic to begin the day prior to your return visit in the office for catheter removal. 4. Catheter care: You will be taught how to take care of the catheter by the nursing staff prior to discharge from the hospital.  You may use both a leg bag and the larger bedside bag but it is recommended to at least use the bigger bedside bag at nighttime as the leg bag is small and will fill up overnight and also does not drain as well when lying flat. You may periodically feel a strong urge to void with the catheter in place.  This is a bladder spasm and most often can occur when having a bowel movement or when you are moving around. It is typically self-limited and usually will stop after a few minutes.  You may use some Vaseline or Neosporin around the tip of the catheter to  reduce friction at the tip of the penis. 5. Incisions: You may remove your dressing bandages the 2nd day after surgery.  You most likely will have a few small staples in each of the incisions and once the bandages are removed, the incisions may stay open to air.  You may start showering (not soaking or bathing in water) 48 hours after surgery and the incisions simply need to be patted dry after the shower.  No additional care is needed. 6. What to call us about: You should call the office [phone]) if you develop fever > 101, persistent vomiting, or the catheter stops draining. Also, feel free to call with any other questions you may have and remember the handout that was provided to you as a reference preoperatively which answers many of the common questions that arise after surgery. 7. You may resume Voltaren, aspirin, advil, aleve, vitamins, and supplements 7 days after surgery.

## 2020-11-02 NOTE — Progress Notes (Signed)
Patient ID: Brandon Sosa, male   DOB: 1971-01-23, 50 y.o.   MRN: [id number]  . Post-op note  Subjective: The patient is doing well.  No complaints.  Objective: Vital signs in last 24 hours: Temp:  [97.7 F (36.5 C)-99.2 F (37.3 C)] 97.7 F (36.5 C) (03/21 1216) Pulse Rate:  [86-105] 105 (03/21 1216) Resp:  [12-18] 16 (03/21 1216) BP: (125-139)/(69-98) 132/82 (03/21 1216) SpO2:  [97 %-100 %] 97 % (03/21 1216) Weight:  [85.7 kg] 85.7 kg (03/21 1216)  Intake/Output from previous day: No intake/output data recorded. Intake/Output this shift: Total I/O In: 3440 [P.O.:240; I.V.:2000; IV Piggyback:1200] Out: 295 [Urine:250; Drains:15; Blood:30]  Physical Exam:  General: Alert and oriented. Abdomen: Soft, Nondistended. Incisions: Clean and dry. GU: Urine clear.  Lab Results: Recent Labs    11/02/20 1115  HGB 13.5  HCT 39.0    Assessment/Plan: POD#0   1) Continue to monitor, ambulate, IS   Pryor Curia. MD   LOS: 0 days   Brandon Sosa 11/02/2020, 2:16 PM

## 2020-11-02 NOTE — Transfer of Care (Signed)
Immediate Anesthesia Transfer of Care Note  Patient: Brandon Sosa  Procedure(s) Performed: XI ROBOTIC ASSISTED LAPAROSCOPIC RADICAL PROSTATECTOMY LEVEL 2 (N/A ) LYMPHADENECTOMY, PELVIC (Bilateral )  Patient Location: PACU  Anesthesia Type:General  Level of Consciousness: awake, alert , oriented and patient cooperative  Airway & Oxygen Therapy: Patient Spontanous Breathing and Patient connected to face mask oxygen  Post-op Assessment: Report given to RN and Post -op Vital signs reviewed and stable  Post vital signs: Reviewed and stable  Last Vitals:  Vitals Value Taken Time  BP 132/82 11/02/20 1055  Temp    Pulse 102 11/02/20 1058  Resp 13 11/02/20 1058  SpO2 100 % 11/02/20 1058  Vitals shown include unvalidated device data.  Last Pain:  Vitals:   11/02/20 0615  TempSrc: Oral  PainSc:          Complications: No complications documented.

## 2020-11-03 ENCOUNTER — Encounter (HOSPITAL_COMMUNITY): Payer: Self-pay | Admitting: Urology

## 2020-11-03 DIAGNOSIS — Z79899 Other long term (current) drug therapy: Secondary | ICD-10-CM | POA: Diagnosis not present

## 2020-11-03 DIAGNOSIS — R972 Elevated prostate specific antigen [PSA]: Secondary | ICD-10-CM | POA: Diagnosis not present

## 2020-11-03 DIAGNOSIS — C61 Malignant neoplasm of prostate: Secondary | ICD-10-CM | POA: Diagnosis not present

## 2020-11-03 LAB — HEMOGLOBIN AND HEMATOCRIT, BLOOD
HCT: 38.2 % — ABNORMAL LOW (ref 39.0–52.0)
Hemoglobin: 13.3 g/dL (ref 13.0–17.0)

## 2020-11-03 MED ORDER — BISACODYL 10 MG RE SUPP
10.0000 mg | Freq: Once | RECTAL | Status: AC
  Administered 2020-11-03: 10 mg via RECTAL
  Filled 2020-11-03: qty 1

## 2020-11-03 MED ORDER — TRAMADOL HCL 50 MG PO TABS: 50.0000 mg | ORAL_TABLET | Freq: Four times a day (QID) | ORAL | Status: DC | PRN

## 2020-11-03 NOTE — Plan of Care (Signed)
  Problem: Health Behavior/Discharge Planning: Goal: Ability to manage health-related needs will improve Outcome: Progressing   Problem: Activity: Goal: Risk for activity intolerance will decrease Outcome: Progressing   Problem: Elimination: Goal: Will not experience complications related to bowel motility Outcome: Progressing   Problem: Elimination: Goal: Will not experience complications related to urinary retention Outcome: Progressing   Problem: Pain Managment: Goal: General experience of comfort will improve Outcome: Progressing   Problem: Clinical Measurements: Goal: Postoperative complications will be avoided or minimized Outcome: Progressing

## 2020-11-03 NOTE — Progress Notes (Signed)
1 Day Post-Op Subjective: Overall doing well. Pain controlled. Small episode of emesis yesterday evening after [place] ice, patient attributed to excessive sugar; no nausea/emesis since. Ambulated several times. No flatus.   Objective: Vital signs in last 24 hours: Temp:  [97.5 F (36.4 C)-98.6 F (37 C)] 98.3 F (36.8 C) (03/22 0447) Pulse Rate:  [88-105] 88 (03/22 0447) Resp:  [12-20] 18 (03/22 0447) BP: (110-136)/(67-86) 110/71 (03/22 0447) SpO2:  [97 %-100 %] 98 % (03/22 0447) Weight:  [85.7 kg] 85.7 kg (03/21 1216)  Intake/Output from previous day: 03/21 0701 - 03/22 0700 In: 6110.4 [P.O.:480; I.V.:4330.4; IV Piggyback:1300] Out: 3535 [Urine:3375; Drains:130; Blood:30] Intake/Output this shift: No intake/output data recorded.  Physical Exam:  General: Alert and oriented, laying in bed in NAD CV: Regular rate Lungs: NWOB on RA GI: Soft, nondistended, appropriately tender. Incisions intact and covered with dermabond; LLQ JP drain with minimal bloody output  Urine: Foley in place draining light yellow urine without clots  Extremities: Warm and well-perfused   Lab Results: Recent Labs    11/02/20 1115 11/03/20 0419  HGB 13.5 13.3  HCT 39.0 38.2*      Assessment/Plan: POD# 1 s/p robotic prostatectomy.  1) Stope IVF 2) Ambulate, Incentive spirometry 3) Transition to oral pain medication 4) Dulcolax suppository 5) D/C pelvic drain 6) Plan for likely discharge later today    LOS: 0 days   Brandon Sosa 11/03/2020, 7:09 AM

## 2020-11-03 NOTE — Discharge Summary (Signed)
  Date of admission: 11/02/2020  Date of discharge: 11/03/2020  Admission diagnosis: Prostate Cancer  Discharge diagnosis: Prostate Cancer  History and Physical: For full details, please see admission history and physical. Briefly, Brandon Sosa is a 50 y.o. gentleman with localized prostate cancer.  After discussing management/treatment options, he elected to proceed with surgical treatment.  Hospital Course: Brandon Sosa was taken to the operating room on 11/02/2020 and underwent a robotic assisted laparoscopic radical prostatectomy. He tolerated this procedure well and without complications. Postoperatively, he was able to be transferred to a regular hospital room following recovery from anesthesia.  He was able to begin ambulating the night of surgery. He remained hemodynamically stable overnight.  He had excellent urine output with appropriately minimal output from his pelvic drain and his pelvic drain was removed on POD #1.  He was transitioned to oral pain medication, tolerated a clear liquid diet, and had met all discharge criteria and was able to be discharged home later on POD#1.  Laboratory values: Recent Labs    11/02/20 1115 11/03/20 0419  HGB 13.5 13.3  HCT 39.0 38.2*    Disposition: Home  Discharge instruction: He was instructed to be ambulatory but to refrain from heavy lifting, strenuous activity, or driving. He was instructed on urethral catheter care.  Discharge medications:   Allergies as of 11/03/2020   No Known Allergies     Medication List    STOP taking these medications   diclofenac 75 MG EC tablet Commonly known as: VOLTAREN   ibuprofen 200 MG tablet Commonly known as: ADVIL     TAKE these medications   acetaminophen 500 MG tablet Commonly known as: TYLENOL Take 1,000 mg by mouth every 6 (six) hours as needed for moderate pain or headache.   famotidine 20 MG tablet Commonly known as: PEPCID Take 20 mg by mouth daily as needed for heartburn or  indigestion.   loratadine 10 MG tablet Commonly known as: CLARITIN Take 10 mg by mouth daily as needed for allergies.   sulfamethoxazole-trimethoprim 800-160 MG tablet Commonly known as: BACTRIM DS Take 1 tablet by mouth 2 (two) times daily. Start the day prior to foley removal appointment   traMADol 50 MG tablet Commonly known as: Ultram Take 1-2 tablets (50-100 mg total) by mouth every 6 (six) hours as needed for moderate pain or severe pain.   Vitamin D (Ergocalciferol) 1.25 MG (50000 UNIT) Caps capsule Commonly known as: DRISDOL Take 50,000 Units by mouth every Tuesday.       Followup: He will followup in 1 week for catheter removal and to discuss his surgical pathology results.

## 2020-11-08 LAB — SURGICAL PATHOLOGY

## 2020-12-03 DIAGNOSIS — M62838 Other muscle spasm: Secondary | ICD-10-CM | POA: Diagnosis not present

## 2020-12-03 DIAGNOSIS — M6281 Muscle weakness (generalized): Secondary | ICD-10-CM | POA: Diagnosis not present

## 2020-12-03 DIAGNOSIS — N393 Stress incontinence (female) (male): Secondary | ICD-10-CM | POA: Diagnosis not present

## 2021-02-24 DIAGNOSIS — N393 Stress incontinence (female) (male): Secondary | ICD-10-CM | POA: Diagnosis not present

## 2021-02-24 DIAGNOSIS — C61 Malignant neoplasm of prostate: Secondary | ICD-10-CM | POA: Diagnosis not present

## 2021-02-24 DIAGNOSIS — N5201 Erectile dysfunction due to arterial insufficiency: Secondary | ICD-10-CM | POA: Diagnosis not present

## 2021-05-04 DIAGNOSIS — D2239 Melanocytic nevi of other parts of face: Secondary | ICD-10-CM | POA: Diagnosis not present

## 2021-05-04 DIAGNOSIS — L814 Other melanin hyperpigmentation: Secondary | ICD-10-CM | POA: Diagnosis not present

## 2021-05-04 DIAGNOSIS — L918 Other hypertrophic disorders of the skin: Secondary | ICD-10-CM | POA: Diagnosis not present

## 2021-05-04 DIAGNOSIS — D225 Melanocytic nevi of trunk: Secondary | ICD-10-CM | POA: Diagnosis not present

## 2021-05-04 DIAGNOSIS — D485 Neoplasm of uncertain behavior of skin: Secondary | ICD-10-CM | POA: Diagnosis not present

## 2021-06-09 DIAGNOSIS — K219 Gastro-esophageal reflux disease without esophagitis: Secondary | ICD-10-CM | POA: Diagnosis not present

## 2021-06-09 DIAGNOSIS — K5790 Diverticulosis of intestine, part unspecified, without perforation or abscess without bleeding: Secondary | ICD-10-CM | POA: Diagnosis not present

## 2021-06-09 DIAGNOSIS — Z1211 Encounter for screening for malignant neoplasm of colon: Secondary | ICD-10-CM | POA: Diagnosis not present

## 2021-07-13 DIAGNOSIS — Z1211 Encounter for screening for malignant neoplasm of colon: Secondary | ICD-10-CM | POA: Diagnosis not present

## 2021-07-13 DIAGNOSIS — Z8546 Personal history of malignant neoplasm of prostate: Secondary | ICD-10-CM | POA: Diagnosis not present

## 2021-09-28 DIAGNOSIS — N5201 Erectile dysfunction due to arterial insufficiency: Secondary | ICD-10-CM | POA: Diagnosis not present

## 2021-09-28 DIAGNOSIS — C61 Malignant neoplasm of prostate: Secondary | ICD-10-CM | POA: Diagnosis not present

## 2021-10-26 DIAGNOSIS — Z8546 Personal history of malignant neoplasm of prostate: Secondary | ICD-10-CM | POA: Diagnosis not present

## 2021-10-26 DIAGNOSIS — E559 Vitamin D deficiency, unspecified: Secondary | ICD-10-CM | POA: Diagnosis not present

## 2021-10-26 DIAGNOSIS — E782 Mixed hyperlipidemia: Secondary | ICD-10-CM | POA: Diagnosis not present

## 2021-10-26 DIAGNOSIS — Z1331 Encounter for screening for depression: Secondary | ICD-10-CM | POA: Diagnosis not present

## 2021-10-26 DIAGNOSIS — Z0000 Encounter for general adult medical examination without abnormal findings: Secondary | ICD-10-CM | POA: Diagnosis not present

## 2022-02-01 DIAGNOSIS — F411 Generalized anxiety disorder: Secondary | ICD-10-CM | POA: Diagnosis not present

## 2022-02-01 DIAGNOSIS — Z8546 Personal history of malignant neoplasm of prostate: Secondary | ICD-10-CM | POA: Diagnosis not present

## 2022-02-01 DIAGNOSIS — E559 Vitamin D deficiency, unspecified: Secondary | ICD-10-CM | POA: Diagnosis not present

## 2022-02-01 DIAGNOSIS — E782 Mixed hyperlipidemia: Secondary | ICD-10-CM | POA: Diagnosis not present

## 2022-05-27 DIAGNOSIS — H6592 Unspecified nonsuppurative otitis media, left ear: Secondary | ICD-10-CM | POA: Diagnosis not present

## 2022-08-30 DIAGNOSIS — E559 Vitamin D deficiency, unspecified: Secondary | ICD-10-CM | POA: Diagnosis not present

## 2022-08-30 DIAGNOSIS — Z8546 Personal history of malignant neoplasm of prostate: Secondary | ICD-10-CM | POA: Diagnosis not present

## 2022-08-30 DIAGNOSIS — Z1329 Encounter for screening for other suspected endocrine disorder: Secondary | ICD-10-CM | POA: Diagnosis not present

## 2022-08-30 DIAGNOSIS — E782 Mixed hyperlipidemia: Secondary | ICD-10-CM | POA: Diagnosis not present

## 2022-08-30 DIAGNOSIS — Z6828 Body mass index (BMI) 28.0-28.9, adult: Secondary | ICD-10-CM | POA: Diagnosis not present

## 2022-08-30 DIAGNOSIS — R5383 Other fatigue: Secondary | ICD-10-CM | POA: Diagnosis not present

## 2022-08-30 DIAGNOSIS — Z0000 Encounter for general adult medical examination without abnormal findings: Secondary | ICD-10-CM | POA: Diagnosis not present

## 2023-02-28 DIAGNOSIS — Z8546 Personal history of malignant neoplasm of prostate: Secondary | ICD-10-CM | POA: Diagnosis not present

## 2023-02-28 DIAGNOSIS — Z6828 Body mass index (BMI) 28.0-28.9, adult: Secondary | ICD-10-CM | POA: Diagnosis not present

## 2023-02-28 DIAGNOSIS — F411 Generalized anxiety disorder: Secondary | ICD-10-CM | POA: Diagnosis not present

## 2023-02-28 DIAGNOSIS — E782 Mixed hyperlipidemia: Secondary | ICD-10-CM | POA: Diagnosis not present

## 2023-05-03 DIAGNOSIS — D485 Neoplasm of uncertain behavior of skin: Secondary | ICD-10-CM | POA: Diagnosis not present

## 2023-05-03 DIAGNOSIS — D225 Melanocytic nevi of trunk: Secondary | ICD-10-CM | POA: Diagnosis not present

## 2023-05-03 DIAGNOSIS — L821 Other seborrheic keratosis: Secondary | ICD-10-CM | POA: Diagnosis not present

## 2023-05-03 DIAGNOSIS — B079 Viral wart, unspecified: Secondary | ICD-10-CM | POA: Diagnosis not present

## 2023-05-03 DIAGNOSIS — L814 Other melanin hyperpigmentation: Secondary | ICD-10-CM | POA: Diagnosis not present

## 2023-05-03 DIAGNOSIS — C44519 Basal cell carcinoma of skin of other part of trunk: Secondary | ICD-10-CM | POA: Diagnosis not present

## 2023-05-03 DIAGNOSIS — L820 Inflamed seborrheic keratosis: Secondary | ICD-10-CM | POA: Diagnosis not present

## 2023-07-11 DIAGNOSIS — C44519 Basal cell carcinoma of skin of other part of trunk: Secondary | ICD-10-CM | POA: Diagnosis not present

## 2023-12-05 DIAGNOSIS — Z0000 Encounter for general adult medical examination without abnormal findings: Secondary | ICD-10-CM | POA: Diagnosis not present

## 2023-12-05 DIAGNOSIS — L309 Dermatitis, unspecified: Secondary | ICD-10-CM | POA: Diagnosis not present

## 2023-12-05 DIAGNOSIS — C44519 Basal cell carcinoma of skin of other part of trunk: Secondary | ICD-10-CM | POA: Diagnosis not present

## 2023-12-05 DIAGNOSIS — D485 Neoplasm of uncertain behavior of skin: Secondary | ICD-10-CM | POA: Diagnosis not present

## 2023-12-05 DIAGNOSIS — Z8546 Personal history of malignant neoplasm of prostate: Secondary | ICD-10-CM | POA: Diagnosis not present

## 2023-12-05 DIAGNOSIS — Z6828 Body mass index (BMI) 28.0-28.9, adult: Secondary | ICD-10-CM | POA: Diagnosis not present

## 2023-12-11 ENCOUNTER — Other Ambulatory Visit (HOSPITAL_BASED_OUTPATIENT_CLINIC_OR_DEPARTMENT_OTHER): Payer: Self-pay | Admitting: Medical

## 2023-12-11 DIAGNOSIS — R590 Localized enlarged lymph nodes: Secondary | ICD-10-CM

## 2023-12-14 DIAGNOSIS — R944 Abnormal results of kidney function studies: Secondary | ICD-10-CM | POA: Diagnosis not present

## 2023-12-15 ENCOUNTER — Ambulatory Visit (HOSPITAL_BASED_OUTPATIENT_CLINIC_OR_DEPARTMENT_OTHER)
Admission: RE | Admit: 2023-12-15 | Discharge: 2023-12-15 | Disposition: A | Discharge status: Home / Self Care | Source: Ambulatory Visit | Attending: Medical | Admitting: Medical

## 2023-12-15 DIAGNOSIS — R1012 Left upper quadrant pain: Secondary | ICD-10-CM

## 2023-12-15 DIAGNOSIS — R590 Localized enlarged lymph nodes: Secondary | ICD-10-CM

## 2023-12-15 DIAGNOSIS — M79622 Pain in left upper arm: Secondary | ICD-10-CM | POA: Diagnosis not present

## 2024-04-06 ENCOUNTER — Other Ambulatory Visit: Payer: Self-pay | Admitting: Medical Genetics

## 2024-05-02 DIAGNOSIS — D225 Melanocytic nevi of trunk: Secondary | ICD-10-CM | POA: Diagnosis not present

## 2024-05-02 DIAGNOSIS — D485 Neoplasm of uncertain behavior of skin: Secondary | ICD-10-CM | POA: Diagnosis not present

## 2024-05-02 DIAGNOSIS — L814 Other melanin hyperpigmentation: Secondary | ICD-10-CM | POA: Diagnosis not present

## 2024-05-02 DIAGNOSIS — L820 Inflamed seborrheic keratosis: Secondary | ICD-10-CM | POA: Diagnosis not present

## 2024-05-02 DIAGNOSIS — D2239 Melanocytic nevi of other parts of face: Secondary | ICD-10-CM | POA: Diagnosis not present

## 2024-05-02 DIAGNOSIS — L821 Other seborrheic keratosis: Secondary | ICD-10-CM | POA: Diagnosis not present

## 2024-06-05 ENCOUNTER — Other Ambulatory Visit: Payer: Self-pay | Admitting: Medical Genetics

## 2024-06-05 DIAGNOSIS — Z006 Encounter for examination for normal comparison and control in clinical research program: Secondary | ICD-10-CM

## 2024-07-12 LAB — GENECONNECT MOLECULAR SCREEN: Genetic Analysis Overall Interpretation: NEGATIVE
